# Patient Record
Sex: Female | Born: 1995 | Race: Black or African American | Hispanic: No | Marital: Married | Smoking: Never smoker
Health system: Southern US, Community
[De-identification: ages and names within clinical notes are randomized; demographics above are authoritative.]

## PROBLEM LIST (undated history)

## (undated) ENCOUNTER — Inpatient Hospital Stay (HOSPITAL_COMMUNITY): Payer: Self-pay

## (undated) DIAGNOSIS — Z789 Other specified health status: Secondary | ICD-10-CM

---

## 2015-12-28 NOTE — L&D Delivery Note (Addendum)
20 year old G1 now P1 with spontaneous onset of labor with uncomplicated labor course.   Delivery Note At 9:47 AM a viable female was delivered via Vaginal, Spontaneous Delivery (Presentation vertex OA: ;  ).  APGAR: 9, 9; weight pending  .   Placenta status: delivered with gentle traction.  Cord: 3 vessel  with the following complications:none .  Anesthesia:  Epidural  Episiotomy: None Lacerations:  Periurethral left repaired Suture Repair: 3.0 vicryl Est. Blood Loss (mL):  150 ml  Mom to postpartum.  Baby to Couplet care / Skin to Skin.  Charlesetta GaribaldiKathryn Lorraine Kooistra CNM 09/30/2016, 10:07 AM  OB FELLOW DELIVERY ATTESTATION  I was gloved and present for the delivery in its entirety, and I agree with the above resident's note.    Ernestina PennaNicholas Broghan Pannone, MD 5:41 PM

## 2016-03-11 ENCOUNTER — Other Ambulatory Visit (HOSPITAL_COMMUNITY): Payer: Self-pay | Admitting: Nurse Practitioner

## 2016-03-11 ENCOUNTER — Encounter (HOSPITAL_COMMUNITY): Payer: Self-pay | Admitting: Nurse Practitioner

## 2016-03-11 DIAGNOSIS — Z3A13 13 weeks gestation of pregnancy: Secondary | ICD-10-CM

## 2016-03-11 DIAGNOSIS — Z3682 Encounter for antenatal screening for nuchal translucency: Secondary | ICD-10-CM

## 2016-04-05 ENCOUNTER — Ambulatory Visit (HOSPITAL_COMMUNITY)
Admission: RE | Admit: 2016-04-05 | Discharge: 2016-04-05 | Disposition: A | Discharge status: Home / Self Care | Payer: Medicaid Other | Source: Ambulatory Visit | Attending: Nurse Practitioner | Admitting: Nurse Practitioner

## 2016-04-05 ENCOUNTER — Encounter (HOSPITAL_COMMUNITY): Payer: Self-pay

## 2016-04-05 DIAGNOSIS — Z36 Encounter for antenatal screening of mother: Secondary | ICD-10-CM | POA: Insufficient documentation

## 2016-04-05 DIAGNOSIS — Z3682 Encounter for antenatal screening for nuchal translucency: Secondary | ICD-10-CM

## 2016-04-05 DIAGNOSIS — Z3A13 13 weeks gestation of pregnancy: Secondary | ICD-10-CM | POA: Insufficient documentation

## 2016-04-09 ENCOUNTER — Other Ambulatory Visit (HOSPITAL_COMMUNITY): Payer: Self-pay

## 2016-08-04 IMAGING — US US MFM FETAL NUCHAL TRANSLUCENCY
1 series · 15 of 28 positions shown · non-contrast
Comparison: none

[Series 1: us mfm fetal nuchal translucency · 15 of 53 slices shown]
[im 1/53]
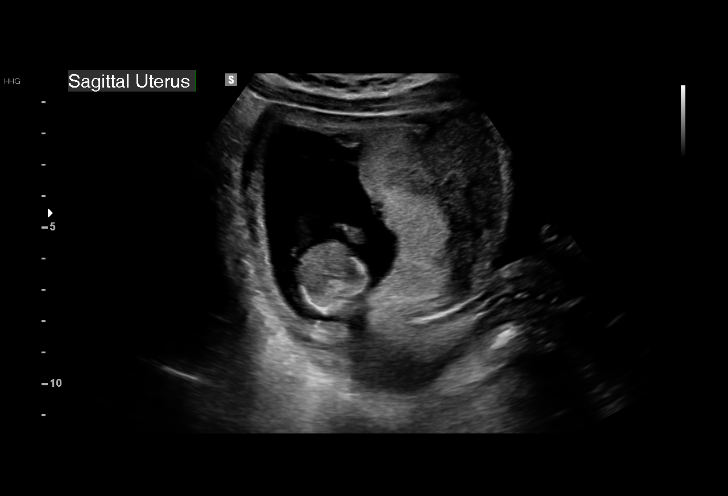
[im 4/53]
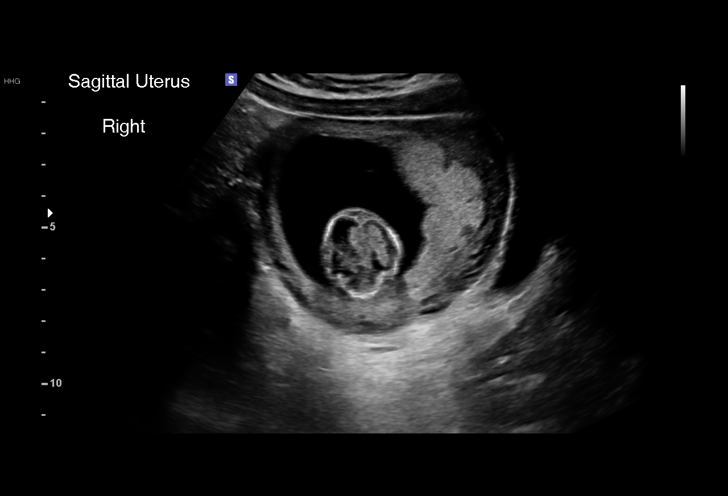
[im 8/53]
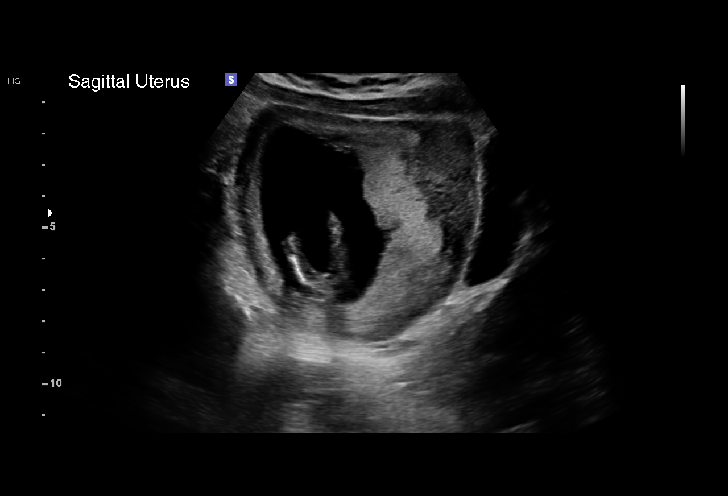
[im 12/53]
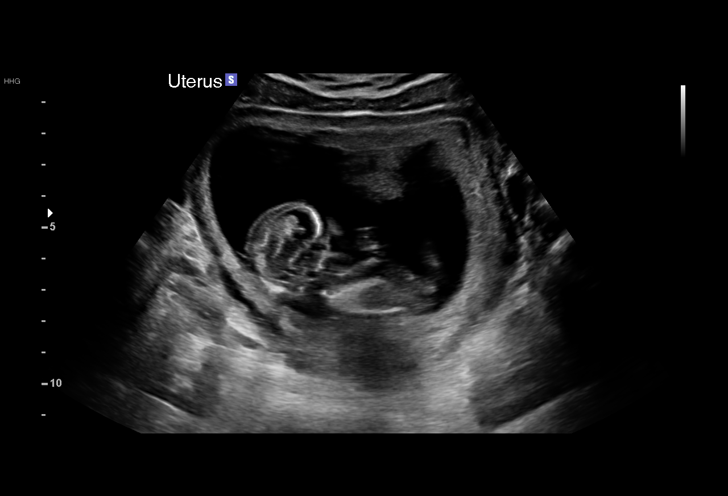
[im 16/53]
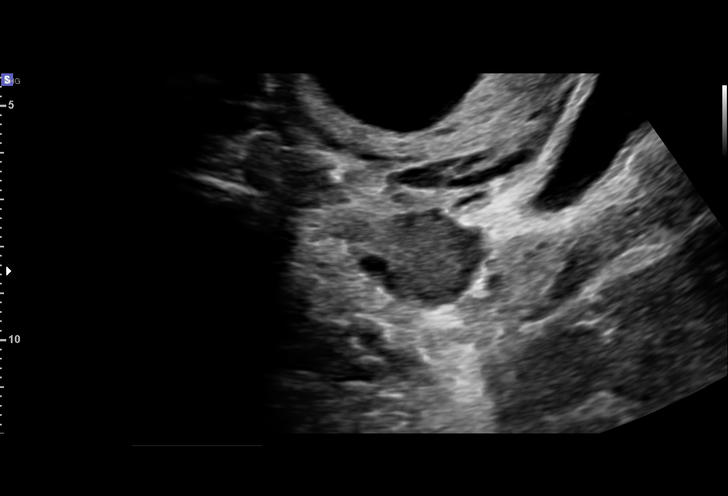
[im 20/53]
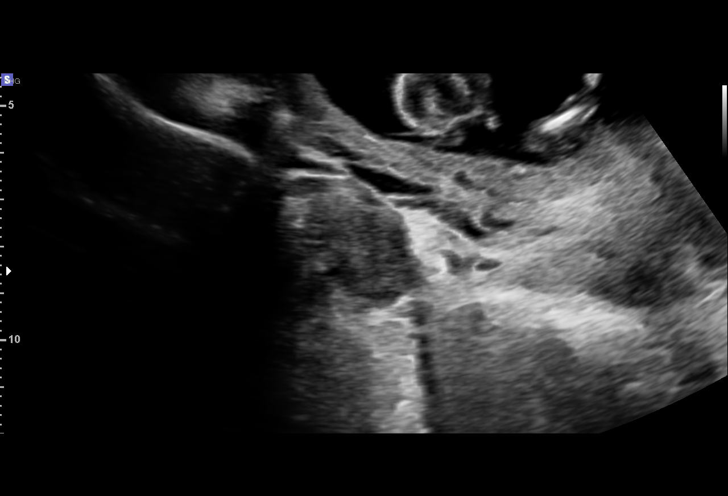
[im 24/53]
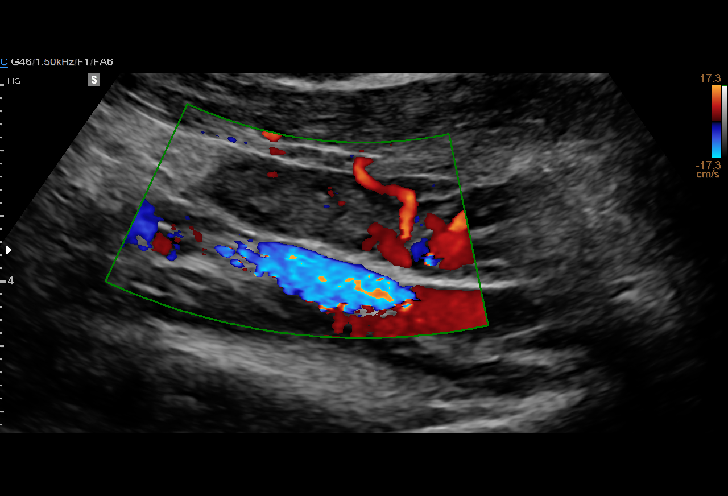
[im 27/53]
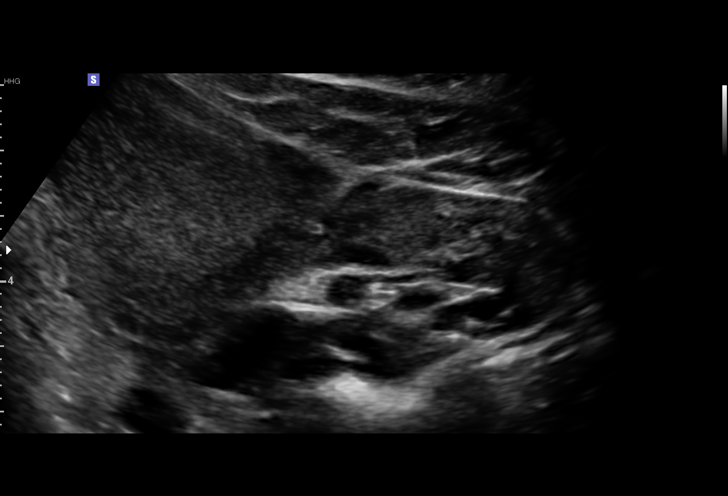
[im 29/53]
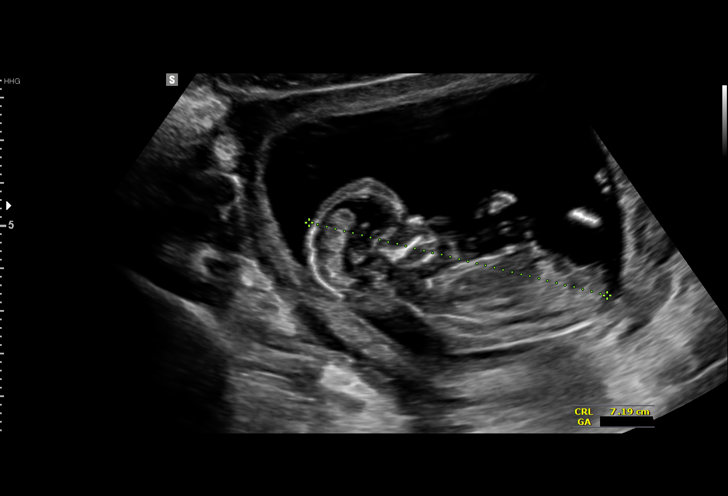
[im 33/53]
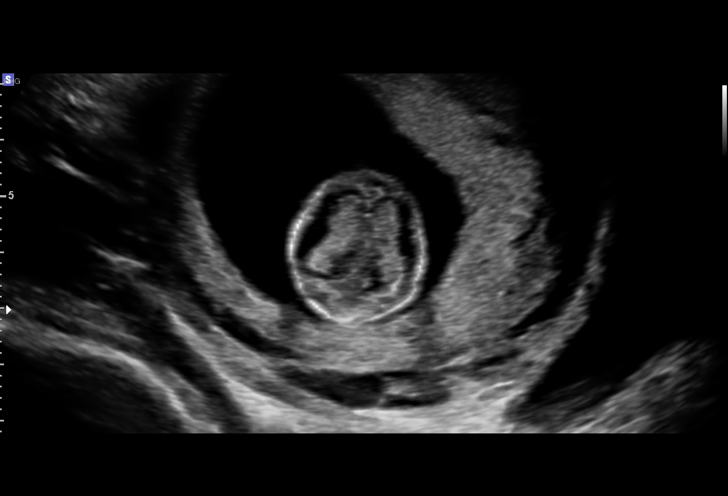
[im 37/53]
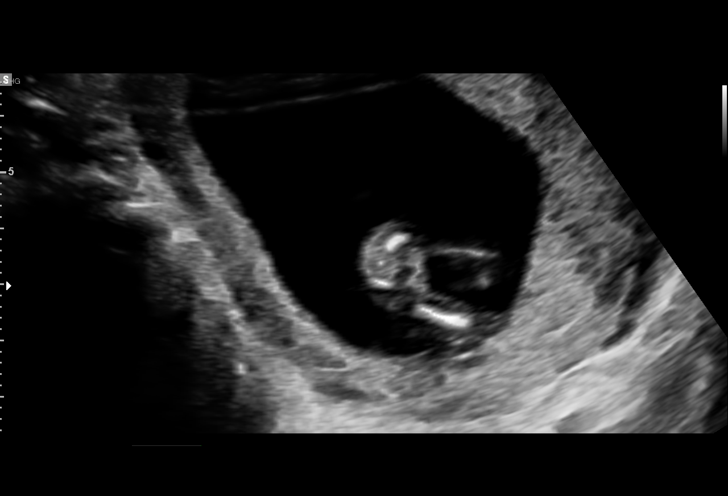
[im 41/53]
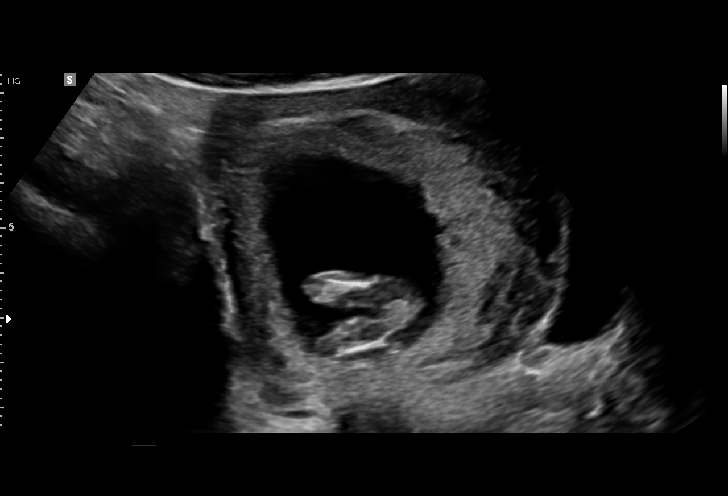
[im 45/53]
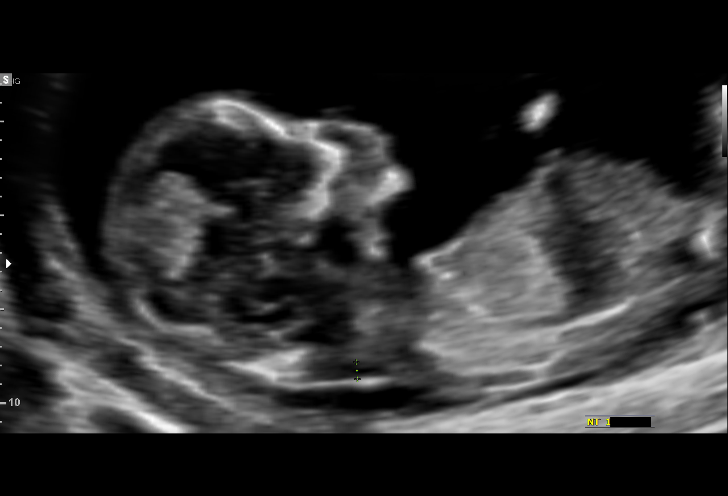
[im 49/53]
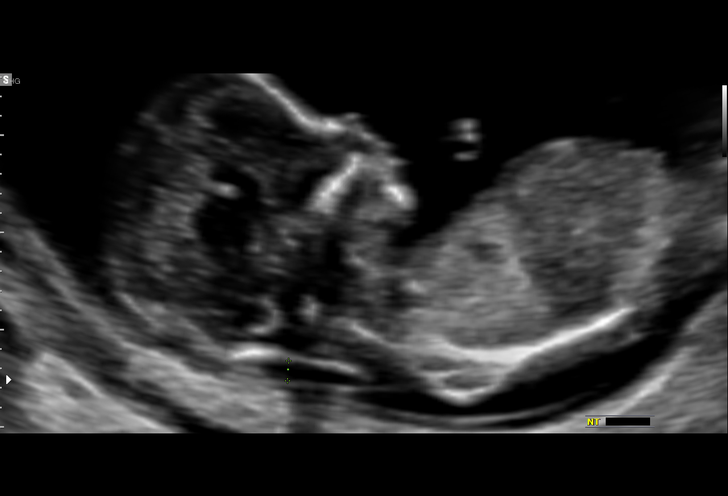
[im 53/53]
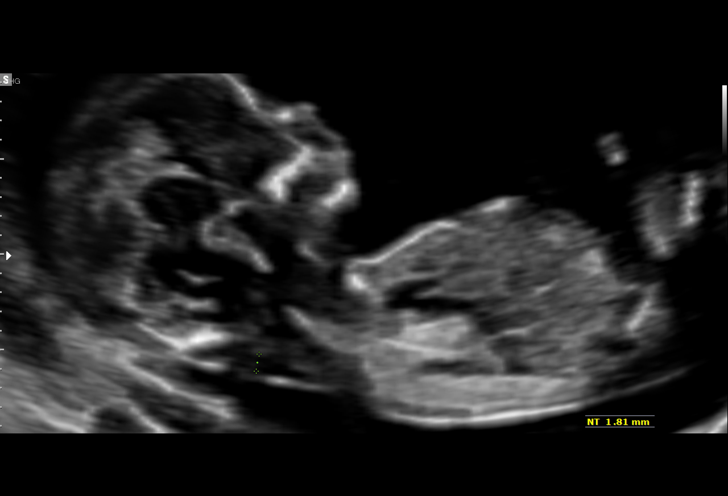

[15 of 28 positions shown; findings below may reference images not displayed]

DASS NP

TRANSLUCENCY

1  Z[facility] MORTENSEN            [id number]      [id number]     [id number]
Indications

First trimester aneuploidy screen (NT)         Z36
13 weeks gestation of pregnancy
Fetal Evaluation

Num Of Fetuses:     1
Fetal Heart         162
Rate(bpm):
Cardiac Activity:   Observed
Placenta:           Anterior, above cervical os

Amniotic Fluid
AFI FV:      Subjectively within normal limits
Gestational Age

LMP:           13w 2d        Date:  01/03/16                 EDD:   10/09/16
Best:          13w 2d     Det. By:  LMP  (01/03/16)          EDD:   10/09/16
1st Trimester Genetic Sonogram Screening

CRL:            72.6  mm    G. Age:   13w 1d                 EDD:   10/10/16
Nuc Trans:       2.1  mm

Nasal Bone:                 Present
Cervix Uterus Adnexa

Cervix
Normal appearance by transabdominal scan.
Uterus
No abnormality visualized.

Left Ovary
Size(cm)       2.7  x   1.9    x  1.1       Vol(ml): 3

Right Ovary
Size(cm)       2.9  x   2.3    x  2.1       Vol(ml):

Adnexa:       No adnexal mass visualized.
Impression

SIUP at 13+2 weeks
No gross abnormalities identified
NT measurement was within normal limits for this GA; NB
present
Normal amniotic fluid volume
Measurements consistent with LMP dating
Recommendations

Offer MSAFP in the second trimester for ONTD screening
Offer anatomy U/S by 18 weeks

## 2016-08-08 ENCOUNTER — Inpatient Hospital Stay (HOSPITAL_COMMUNITY)
Admission: AD | Admit: 2016-08-08 | Discharge: 2016-08-08 | Disposition: A | Discharge status: Home / Self Care | Payer: Medicaid Other | Source: Ambulatory Visit | Attending: Obstetrics & Gynecology | Admitting: Obstetrics & Gynecology

## 2016-08-08 DIAGNOSIS — O26893 Other specified pregnancy related conditions, third trimester: Secondary | ICD-10-CM | POA: Diagnosis present

## 2016-08-08 DIAGNOSIS — Z3A31 31 weeks gestation of pregnancy: Secondary | ICD-10-CM | POA: Insufficient documentation

## 2016-08-08 DIAGNOSIS — N898 Other specified noninflammatory disorders of vagina: Secondary | ICD-10-CM | POA: Insufficient documentation

## 2016-08-08 LAB — URINALYSIS, ROUTINE W REFLEX MICROSCOPIC
Bilirubin Urine: NEGATIVE
Glucose, UA: NEGATIVE mg/dL
Hgb urine dipstick: NEGATIVE
KETONES UR: NEGATIVE mg/dL
LEUKOCYTES UA: NEGATIVE
NITRITE: NEGATIVE
PROTEIN: NEGATIVE mg/dL
Specific Gravity, Urine: 1.01 (ref 1.005–1.030)
pH: 7 (ref 5.0–8.0)

## 2016-08-08 LAB — POCT FERN TEST: POCT FERN TEST: NEGATIVE

## 2016-08-08 NOTE — MAU Provider Note (Signed)
History   [id number]   Chief Complaint  Patient presents with  . Vaginal Discharge    HPI Alexandra Wright is a 20 y.o. female  G1P0000 here with report of watery vaginal discharge that began at approximately 2000.  Leaking of fluid has not continued.  Pt denies contractions and denies vaginal bleeding.  Last intercourse 3 hours prior to the discharge.   +fetal movement.  Denies any problems during this pregnancy.  [facility]    All other systems negative.    Patient's last menstrual period was 01/03/2016 (lmp unknown).  OB History  Gravida Para Term Preterm AB Living  1 0 0 0 0 0  SAB TAB Ectopic Multiple Live Births  0 0 0 0      # Outcome Date GA Lbr Len/2nd Weight Sex Delivery Anes PTL Lv  1 Current               No past medical history on file.  No family history on file.  Social History   Social History  . Marital status: Married    Spouse name: N/A  . Number of children: N/A  . Years of education: N/A   Social History Main Topics  . Smoking status: Not on file  . Smokeless tobacco: Not on file  . Alcohol use Not on file  . Drug use: Unknown  . Sexual activity: Not on file   Other Topics Concern  . Not on file   Social History Narrative  . No narrative on file    No Known Allergies  No current facility-administered medications on file prior to encounter.    Current Outpatient Prescriptions on File Prior to Encounter  Medication Sig Dispense Refill  . Prenatal Vit-Fe Fumarate-FA (PRENATAL MULTIVITAMIN) TABS tablet Take 1 tablet by mouth daily at 12 noon.       Review of Systems  Gastrointestinal: Negative for abdominal pain.  Genitourinary: Positive for vaginal discharge. Negative for pelvic pain and vaginal bleeding.  All other systems reviewed and are negative.    Physical Exam   Vitals:   08/08/16 2250  BP: 102/59  Pulse: 101  Resp: 18  Temp: 98.3 F (36.8 C)  TempSrc: Oral    Physical Exam   Constitutional: She is oriented to person, place, and time. She appears well-developed and well-nourished. No distress.  HENT:  Head: Normocephalic.  Neck: Normal range of motion. Neck supple.  Cardiovascular: Normal rate, regular rhythm and normal heart sounds.   Respiratory: Effort normal and breath sounds normal.  GI: Soft. There is no tenderness.  Genitourinary: No bleeding in the vagina. No vaginal discharge found.  Genitourinary Comments: Negative ferns; negative pooling  Neurological: She is alert and oriented to person, place, and time.  Skin: Skin is warm and dry.   FHR 140's, +accels Toco - none  Dilation: Closed Effacement (%): Thick Exam by:: Margarita Mail, CNM  MAU Course  Procedures  MDM Results for orders placed or performed during the hospital encounter of 08/08/16 (from the past 24 hour(s))  Urinalysis, Routine w reflex microscopic (not at [facility])     Status: None   Collection Time: 08/08/16 10:47 PM  Result Value Ref Range   Color, Urine YELLOW YELLOW   APPearance CLEAR CLEAR   Specific Gravity, Urine 1.010 1.005 - 1.030   pH 7.0 5.0 - 8.0   Glucose, UA NEGATIVE NEGATIVE mg/dL   Hgb urine dipstick NEGATIVE NEGATIVE   Bilirubin Urine NEGATIVE NEGATIVE  Ketones, ur NEGATIVE NEGATIVE mg/dL   Protein, ur NEGATIVE NEGATIVE mg/dL   Nitrite NEGATIVE NEGATIVE   Leukocytes, UA NEGATIVE NEGATIVE  Fern Test     Status: None   Collection Time: 08/08/16 11:07 PM  Result Value Ref Range   POCT Fern Test Negative = intact amniotic membranes      Assessment and Plan  20 y.o. G1P0000 at 156w1d IUP Vaginal Discharge s/p Sexual Intercourse Reactive NST  Plan: Discharge home Explained normalcy of discharge after intercourse Keep scheduled appt   Marlis EdelsonWalidah N Karim, CNM 08/08/2016 11:25 PM

## 2016-08-08 NOTE — MAU Note (Signed)
Pt presents complaining of a gush of fluid at home. States it has stopped but seemed mucousy at home. Denies vaginal bleeding. Reports good fetal movement. States the fluid was clear.

## 2016-09-29 ENCOUNTER — Encounter (HOSPITAL_COMMUNITY): Payer: Self-pay

## 2016-09-29 ENCOUNTER — Inpatient Hospital Stay (HOSPITAL_COMMUNITY): Payer: Medicaid Other

## 2016-09-29 ENCOUNTER — Inpatient Hospital Stay (HOSPITAL_COMMUNITY)
Admission: AD | Admit: 2016-09-29 | Discharge: 2016-09-29 | Disposition: A | Discharge status: Home / Self Care | Payer: Medicaid Other | Source: Ambulatory Visit | Attending: Obstetrics and Gynecology | Admitting: Obstetrics and Gynecology

## 2016-09-29 DIAGNOSIS — Z3A38 38 weeks gestation of pregnancy: Secondary | ICD-10-CM | POA: Insufficient documentation

## 2016-09-29 DIAGNOSIS — O283 Abnormal ultrasonic finding on antenatal screening of mother: Secondary | ICD-10-CM | POA: Diagnosis present

## 2016-09-29 NOTE — MAU Note (Signed)
Patient presents with ctx every 5 min. Patient denies any bleeding or LOF.

## 2016-09-29 NOTE — MAU Provider Note (Signed)
Called by RN regarding fetal heart tracing. Patient came in for a labor evaluation. Patient is NOT found to be in labor. However, FHT is not reactive, but is category I. 145, mod var, no accels, no decels. IVF has been given, PO intake was tried.   3:54 AM - Will perform BPP.   4:21 AM - BPP was 8/8. OK for discharge.  Cleda ClarksElizabeth W. Christmas Faraci, DO  [facility] Fellow [facility]

## 2016-09-29 NOTE — Progress Notes (Signed)
Called to notify of pt arrival in MAU. MD to call back

## 2016-09-29 NOTE — Discharge Instructions (Signed)
Third Trimester of Pregnancy °The third trimester is from week 29 through week 42, months 7 through 9. The third trimester is a time when the fetus is growing rapidly. At the end of the ninth month, the fetus is about 20 inches in length and weighs 6-10 pounds.  °BODY CHANGES °Your body goes through many changes during pregnancy. The changes vary from woman to woman.  °· Your weight will continue to increase. You can expect to gain 25-35 pounds (11-16 kg) by the end of the pregnancy. °· You may begin to get stretch marks on your hips, abdomen, and breasts. °· You may urinate more often because the fetus is moving lower into your pelvis and pressing on your bladder. °· You may develop or continue to have heartburn as a result of your pregnancy. °· You may develop constipation because certain hormones are causing the muscles that push waste through your intestines to slow down. °· You may develop hemorrhoids or swollen, bulging veins (varicose veins). °· You may have pelvic pain because of the weight gain and pregnancy hormones relaxing your joints between the bones in your pelvis. Backaches may result from overexertion of the muscles supporting your posture. °· You may have changes in your hair. These can include thickening of your hair, rapid growth, and changes in texture. Some women also have hair loss during or after pregnancy, or hair that feels dry or thin. Your hair will most likely return to normal after your baby is born. °· Your breasts will continue to grow and be tender. A yellow discharge may leak from your breasts called colostrum. °· Your belly button may stick out. °· You may feel short of breath because of your expanding uterus. °· You may notice the fetus "dropping," or moving lower in your abdomen. °· You may have a bloody mucus discharge. This usually occurs a few days to a week before labor begins. °· Your cervix becomes thin and soft (effaced) near your due date. °WHAT TO EXPECT AT YOUR PRENATAL  EXAMS  °You will have prenatal exams every 2 weeks until week 36. Then, you will have weekly prenatal exams. During a routine prenatal visit: °· You will be weighed to make sure you and the fetus are growing normally. °· Your blood pressure is taken. °· Your abdomen will be measured to track your baby's growth. °· The fetal heartbeat will be listened to. °· Any test results from the previous visit will be discussed. °· You may have a cervical check near your due date to see if you have effaced. °At around 36 weeks, your caregiver will check your cervix. At the same time, your caregiver will also perform a test on the secretions of the vaginal tissue. This test is to determine if a type of bacteria, Group B streptococcus, is present. Your caregiver will explain this further. °Your caregiver may ask you: °· What your birth plan is. °· How you are feeling. °· If you are feeling the baby move. °· If you have had any abnormal symptoms, such as leaking fluid, bleeding, severe headaches, or abdominal cramping. °· If you are using any tobacco products, including cigarettes, chewing tobacco, and electronic cigarettes. °· If you have any questions. °Other tests or screenings that may be performed during your third trimester include: °· Blood tests that check for low iron levels (anemia). °· Fetal testing to check the health, activity level, and growth of the fetus. Testing is done if you have certain medical conditions or if   there are problems during the pregnancy. °· HIV (human immunodeficiency virus) testing. If you are at high risk, you may be screened for HIV during your third trimester of pregnancy. °FALSE LABOR °You may feel small, irregular contractions that eventually go away. These are called Braxton Hicks contractions, or false labor. Contractions may last for hours, days, or even weeks before true labor sets in. If contractions come at regular intervals, intensify, or become painful, it is best to be seen by your  caregiver.  °SIGNS OF LABOR  °· Menstrual-like cramps. °· Contractions that are 5 minutes apart or less. °· Contractions that start on the top of the uterus and spread down to the lower abdomen and back. °· A sense of increased pelvic pressure or back pain. °· A watery or bloody mucus discharge that comes from the vagina. °If you have any of these signs before the 37th week of pregnancy, call your caregiver right away. You need to go to the hospital to get checked immediately. °HOME CARE INSTRUCTIONS  °· Avoid all smoking, herbs, alcohol, and unprescribed drugs. These chemicals affect the formation and growth of the baby. °· Do not use any tobacco products, including cigarettes, chewing tobacco, and electronic cigarettes. If you need help quitting, ask your health care provider. You may receive counseling support and other resources to help you quit. °· Follow your caregiver's instructions regarding medicine use. There are medicines that are either safe or unsafe to take during pregnancy. °· Exercise only as directed by your caregiver. Experiencing uterine cramps is a good sign to stop exercising. °· Continue to eat regular, healthy meals. °· Wear a good support bra for breast tenderness. °· Do not use hot tubs, steam rooms, or saunas. °· Wear your seat belt at all times when driving. °· Avoid raw meat, uncooked cheese, cat litter boxes, and soil used by cats. These carry germs that can cause birth defects in the baby. °· Take your prenatal vitamins. °· Take 1500-2000 mg of calcium daily starting at the 20th week of pregnancy until you deliver your baby. °· Try taking a stool softener (if your caregiver approves) if you develop constipation. Eat more high-fiber foods, such as fresh vegetables or fruit and whole grains. Drink plenty of fluids to keep your urine clear or pale yellow. °· Take warm sitz baths to soothe any pain or discomfort caused by hemorrhoids. Use hemorrhoid cream if your caregiver approves. °· If  you develop varicose veins, wear support hose. Elevate your feet for 15 minutes, 3-4 times a day. Limit salt in your diet. °· Avoid heavy lifting, wear low heal shoes, and practice good posture. °· Rest a lot with your legs elevated if you have leg cramps or low back pain. °· Visit your dentist if you have not gone during your pregnancy. Use a soft toothbrush to brush your teeth and be gentle when you floss. °· A sexual relationship may be continued unless your caregiver directs you otherwise. °· Do not travel far distances unless it is absolutely necessary and only with the approval of your caregiver. °· Take prenatal classes to understand, practice, and ask questions about the labor and delivery. °· Make a trial run to the hospital. °· Pack your hospital bag. °· Prepare the baby's nursery. °· Continue to go to all your prenatal visits as directed by your caregiver. °SEEK MEDICAL CARE IF: °· You are unsure if you are in labor or if your water has broken. °· You have dizziness. °· You have   mild pelvic cramps, pelvic pressure, or nagging pain in your abdominal area. °· You have persistent nausea, vomiting, or diarrhea. °· You have a bad smelling vaginal discharge. °· You have pain with urination. °SEEK IMMEDIATE MEDICAL CARE IF:  °· You have a fever. °· You are leaking fluid from your vagina. °· You have spotting or bleeding from your vagina. °· You have severe abdominal cramping or pain. °· You have rapid weight loss or gain. °· You have shortness of breath with chest pain. °· You notice sudden or extreme swelling of your face, hands, ankles, feet, or legs. °· You have not felt your baby move in over an hour. °· You have severe headaches that do not go away with medicine. °· You have vision changes. °  °This information is not intended to replace advice given to you by your health care provider. Make sure you discuss any questions you have with your health care provider. °  °Document Released: 12/07/2001 Document  Revised: 01/03/2015 Document Reviewed: 02/13/2013 °[employer]vier Interactive Patient Education ©2016 [employer] °Fetal Movement Counts °Patient Name: __________________________________________________ Patient Due Date: ____________________ °Performing a fetal movement count is highly recommended in high-risk pregnancies, but it is good for every pregnant woman to do. Your health care provider may ask you to start counting fetal movements at 28 weeks of the pregnancy. Fetal movements often increase: °· After eating a full meal. °· After physical activity. °· After eating or drinking something sweet or cold. °· At rest. °Pay attention to when you feel the baby is most active. This will help you notice a pattern of your baby's sleep and wake cycles and what factors contribute to an increase in fetal movement. It is important to perform a fetal movement count at the same time each day when your baby is normally most active.  °HOW TO COUNT FETAL MOVEMENTS °1. Find a quiet and comfortable area to sit or lie down on your left side. Lying on your left side provides the best blood and oxygen circulation to your baby. °2. Write down the day and time on a sheet of paper or in a journal. °3. Start counting kicks, flutters, swishes, rolls, or jabs in a 2-hour period. You should feel at least 10 movements within 2 hours. °4. If you do not feel 10 movements in 2 hours, wait 2-3 hours and count again. Look for a change in the pattern or not enough counts in 2 hours. °SEEK MEDICAL CARE IF: °· You feel less than 10 counts in 2 hours, tried twice. °· There is no movement in over an hour. °· The pattern is changing or taking longer each day to reach 10 counts in 2 hours. °· You feel the baby is not moving as he or she usually does. °Date: ____________ Movements: ____________ Start time: ____________ Finish time: ____________  °Date: ____________ Movements: ____________ Start time: ____________ Finish time: ____________ °Date:  ____________ Movements: ____________ Start time: ____________ Finish time: ____________ °Date: ____________ Movements: ____________ Start time: ____________ Finish time: ____________ °Date: ____________ Movements: ____________ Start time: ____________ Finish time: ____________ °Date: ____________ Movements: ____________ Start time: ____________ Finish time: ____________ °Date: ____________ Movements: ____________ Start time: ____________ Finish time: ____________ °Date: ____________ Movements: ____________ Start time: ____________ Finish time: ____________  °Date: ____________ Movements: ____________ Start time: ____________ Finish time: ____________ °Date: ____________ Movements: ____________ Start time: ____________ Finish time: ____________ °Date: ____________ Movements: ____________ Start time: ____________ Finish time: ____________ °Date: ____________ Movements: ____________ Start time: ____________ Finish time: ____________ °Date:   ____________ Movements: ____________ Start time: ____________ Finish time: ____________ °Date: ____________ Movements: ____________ Start time: ____________ Finish time: ____________ °Date: ____________ Movements: ____________ Start time: ____________ Finish time: ____________  °Date: ____________ Movements: ____________ Start time: ____________ Finish time: ____________ °Date: ____________ Movements: ____________ Start time: ____________ Finish time: ____________ °Date: ____________ Movements: ____________ Start time: ____________ Finish time: ____________ °Date: ____________ Movements: ____________ Start time: ____________ Finish time: ____________ °Date: ____________ Movements: ____________ Start time: ____________ Finish time: ____________ °Date: ____________ Movements: ____________ Start time: ____________ Finish time: ____________ °Date: ____________ Movements: ____________ Start time: ____________ Finish time: ____________  °Date: ____________ Movements: ____________ Start  time: ____________ Finish time: ____________ °Date: ____________ Movements: ____________ Start time: ____________ Finish time: ____________ °Date: ____________ Movements: ____________ Start time: ____________ Finish time: ____________ °Date: ____________ Movements: ____________ Start time: ____________ Finish time: ____________ °Date: ____________ Movements: ____________ Start time: ____________ Finish time: ____________ °Date: ____________ Movements: ____________ Start time: ____________ Finish time: ____________ °Date: ____________ Movements: ____________ Start time: ____________ Finish time: ____________  °Date: ____________ Movements: ____________ Start time: ____________ Finish time: ____________ °Date: ____________ Movements: ____________ Start time: ____________ Finish time: ____________ °Date: ____________ Movements: ____________ Start time: ____________ Finish time: ____________ °Date: ____________ Movements: ____________ Start time: ____________ Finish time: ____________ °Date: ____________ Movements: ____________ Start time: ____________ Finish time: ____________ °Date: ____________ Movements: ____________ Start time: ____________ Finish time: ____________ °Date: ____________ Movements: ____________ Start time: ____________ Finish time: ____________  °Date: ____________ Movements: ____________ Start time: ____________ Finish time: ____________ °Date: ____________ Movements: ____________ Start time: ____________ Finish time: ____________ °Date: ____________ Movements: ____________ Start time: ____________ Finish time: ____________ °Date: ____________ Movements: ____________ Start time: ____________ Finish time: ____________ °Date: ____________ Movements: ____________ Start time: ____________ Finish time: ____________ °Date: ____________ Movements: ____________ Start time: ____________ Finish time: ____________ °Date: ____________ Movements: ____________ Start time: ____________ Finish time: ____________    °Date: ____________ Movements: ____________ Start time: ____________ Finish time: ____________ °Date: ____________ Movements: ____________ Start time: ____________ Finish time: ____________ °Date: ____________ Movements: ____________ Start time: ____________ Finish time: ____________ °Date: ____________ Movements: ____________ Start time: ____________ Finish time: ____________ °Date: ____________ Movements: ____________ Start time: ____________ Finish time: ____________ °Date: ____________ Movements: ____________ Start time: ____________ Finish time: ____________ °Date: ____________ Movements: ____________ Start time: ____________ Finish time: ____________  °Date: ____________ Movements: ____________ Start time: ____________ Finish time: ____________ °Date: ____________ Movements: ____________ Start time: ____________ Finish time: ____________ °Date: ____________ Movements: ____________ Start time: ____________ Finish time: ____________ °Date: ____________ Movements: ____________ Start time: ____________ Finish time: ____________ °Date: ____________ Movements: ____________ Start time: ____________ Finish time: ____________ °Date: ____________ Movements: ____________ Start time: ____________ Finish time: ____________ °  °This information is not intended to replace advice given to you by your health care provider. Make sure you discuss any questions you have with your health care provider. °  °Document Released: 01/12/2007 Document Revised: 01/03/2015 Document Reviewed: 10/09/2012 °[employer]vier Interactive Patient Education ©2016 [employer] °Braxton Hicks Contractions °Contractions of the uterus can occur throughout pregnancy. Contractions are not always a sign that you are in labor.  °WHAT ARE BRAXTON HICKS CONTRACTIONS?  °Contractions that occur before labor are called Braxton Hicks contractions, or false labor. Toward the end of pregnancy (32-34 weeks), these contractions can develop more often and may become more  forceful. This is not true labor because these contractions do not result in opening (dilatation) and thinning of the cervix. They are sometimes difficult to tell apart from true labor because these contractions can be forceful and people have different pain tolerances. You   should not feel embarrassed if you go to the hospital with false labor. Sometimes, the only way to tell if you are in true labor is for your health care provider to look for changes in the cervix. °If there are no prenatal problems or other health problems associated with the pregnancy, it is completely safe to be sent home with false labor and await the onset of true labor. °HOW CAN YOU TELL THE DIFFERENCE BETWEEN TRUE AND FALSE LABOR? °False Labor °· The contractions of false labor are usually shorter and not as hard as those of true labor.   °· The contractions are usually irregular.   °· The contractions are often felt in the front of the lower abdomen and in the groin.   °· The contractions may go away when you walk around or change positions while lying down.   °· The contractions get weaker and are shorter lasting as time goes on.   °· The contractions do not usually become progressively stronger, regular, and closer together as with true labor.   °True Labor °· Contractions in true labor last 30-70 seconds, become very regular, usually become more intense, and increase in frequency.   °· The contractions do not go away with walking.   °· The discomfort is usually felt in the top of the uterus and spreads to the lower abdomen and low back.   °· True labor can be determined by your health care provider with an exam. This will show that the cervix is dilating and getting thinner.   °WHAT TO REMEMBER °· Keep up with your usual exercises and follow other instructions given by your health care provider.   °· Take medicines as directed by your health care provider.   °· Keep your regular prenatal appointments.   °· Eat and drink lightly if you  think you are going into labor.   °· If Braxton Hicks contractions are making you uncomfortable:   °¨ Change your position from lying down or resting to walking, or from walking to resting.   °¨ Sit and rest in a tub of warm water.   °¨ Drink 2-3 glasses of water. Dehydration may cause these contractions.   °¨ Do slow and deep breathing several times an hour.   °WHEN SHOULD I SEEK IMMEDIATE MEDICAL CARE? °Seek immediate medical care if: °· Your contractions become stronger, more regular, and closer together.   °· You have fluid leaking or gushing from your vagina.   °· You have a fever.   °· You pass blood-tinged mucus.   °· You have vaginal bleeding.   °· You have continuous abdominal pain.   °· You have low back pain that you never had before.   °· You feel your baby's head pushing down and causing pelvic pressure.   °· Your baby is not moving as much as it used to.   °  °This information is not intended to replace advice given to you by your health care provider. Make sure you discuss any questions you have with your health care provider. °  °Document Released: 12/13/2005 Document Revised: 12/18/2013 Document Reviewed: 09/24/2013 °Elsevier Interactive Patient Education ©2016 [employer] ° °

## 2016-09-29 NOTE — Progress Notes (Signed)
Called back for report on patient. Report given regarding cervical exam and non reactive NST. Will talk to Dr. Omer JackMumaw and call back

## 2016-09-29 NOTE — Progress Notes (Signed)
Called to notify of pt BPP results and asked if patient needs to be back on the monitor. Will talk to Dr. Omer JackMumaw and call back

## 2016-09-30 ENCOUNTER — Inpatient Hospital Stay (HOSPITAL_COMMUNITY)
Admission: AD | Admit: 2016-09-30 | Discharge: 2016-10-02 | DRG: 775 | Disposition: A | Discharge status: Home / Self Care | Payer: Medicaid Other | Source: Ambulatory Visit | Attending: Obstetrics and Gynecology | Admitting: Obstetrics and Gynecology

## 2016-09-30 ENCOUNTER — Inpatient Hospital Stay (HOSPITAL_COMMUNITY): Payer: Medicaid Other | Admitting: Anesthesiology

## 2016-09-30 ENCOUNTER — Encounter (HOSPITAL_COMMUNITY): Payer: Self-pay

## 2016-09-30 DIAGNOSIS — Z3403 Encounter for supervision of normal first pregnancy, third trimester: Secondary | ICD-10-CM | POA: Diagnosis present

## 2016-09-30 DIAGNOSIS — Z3A38 38 weeks gestation of pregnancy: Secondary | ICD-10-CM

## 2016-09-30 DIAGNOSIS — O99824 Streptococcus B carrier state complicating childbirth: Secondary | ICD-10-CM | POA: Diagnosis present

## 2016-09-30 HISTORY — DX: Other specified health status: Z78.9

## 2016-09-30 LAB — CBC
HCT: 33.2 % — ABNORMAL LOW (ref 36.0–46.0)
Hemoglobin: 11.5 g/dL — ABNORMAL LOW (ref 12.0–15.0)
MCH: 28.6 pg (ref 26.0–34.0)
MCHC: 34.6 g/dL (ref 30.0–36.0)
MCV: 82.6 fL (ref 78.0–100.0)
PLATELETS: 277 10*3/uL (ref 150–400)
RBC: 4.02 MIL/uL (ref 3.87–5.11)
RDW: 13.3 % (ref 11.5–15.5)
WBC: 7.5 10*3/uL (ref 4.0–10.5)

## 2016-09-30 LAB — OB RESULTS CONSOLE ABO/RH: RH TYPE: POSITIVE

## 2016-09-30 LAB — TYPE AND SCREEN
ABO/RH(D): O POS
ANTIBODY SCREEN: NEGATIVE

## 2016-09-30 LAB — OB RESULTS CONSOLE HEPATITIS B SURFACE ANTIGEN: HEP B S AG: NEGATIVE

## 2016-09-30 LAB — OB RESULTS CONSOLE HIV ANTIBODY (ROUTINE TESTING): HIV: NONREACTIVE

## 2016-09-30 LAB — OB RESULTS CONSOLE GBS: GBS: POSITIVE

## 2016-09-30 LAB — OB RESULTS CONSOLE RUBELLA ANTIBODY, IGM: Rubella: IMMUNE

## 2016-09-30 LAB — OB RESULTS CONSOLE GC/CHLAMYDIA
Chlamydia: NEGATIVE
Gonorrhea: NEGATIVE

## 2016-09-30 LAB — ABO/RH: ABO/RH(D): O POS

## 2016-09-30 LAB — OB RESULTS CONSOLE RPR: RPR: NONREACTIVE

## 2016-09-30 MED ORDER — COCONUT OIL OIL
1.0000 "application " | TOPICAL_OIL | Status: DC | PRN
  Filled 2016-09-30: qty 120

## 2016-09-30 MED ORDER — DIPHENHYDRAMINE HCL 50 MG/ML IJ SOLN: 12.5000 mg | INTRAMUSCULAR | Status: DC | PRN

## 2016-09-30 MED ORDER — FENTANYL CITRATE (PF) 100 MCG/2ML IJ SOLN
50.0000 ug | INTRAMUSCULAR | Status: DC | PRN
  Administered 2016-09-30: 100 ug via INTRAVENOUS
  Filled 2016-09-30: qty 2

## 2016-09-30 MED ORDER — DIPHENHYDRAMINE HCL 25 MG PO CAPS: 25.0000 mg | ORAL_CAPSULE | Freq: Four times a day (QID) | ORAL | Status: DC | PRN

## 2016-09-30 MED ORDER — FLEET ENEMA 7-19 GM/118ML RE ENEM: 1.0000 | ENEMA | RECTAL | Status: DC | PRN

## 2016-09-30 MED ORDER — BENZOCAINE-MENTHOL 20-0.5 % EX AERO
1.0000 "application " | INHALATION_SPRAY | CUTANEOUS | Status: DC | PRN
  Filled 2016-09-30: qty 56

## 2016-09-30 MED ORDER — WITCH HAZEL-GLYCERIN EX PADS: 1.0000 "application " | MEDICATED_PAD | CUTANEOUS | Status: DC | PRN

## 2016-09-30 MED ORDER — PHENYLEPHRINE 40 MCG/ML (10ML) SYRINGE FOR IV PUSH (FOR BLOOD PRESSURE SUPPORT)
80.0000 ug | PREFILLED_SYRINGE | INTRAVENOUS | Status: DC | PRN
  Filled 2016-09-30: qty 5

## 2016-09-30 MED ORDER — EPHEDRINE 5 MG/ML INJ
10.0000 mg | INTRAVENOUS | Status: DC | PRN
  Filled 2016-09-30: qty 4

## 2016-09-30 MED ORDER — PENICILLIN G POTASSIUM 5000000 UNITS IJ SOLR
5.0000 10*6.[IU] | Freq: Once | INTRAVENOUS | Status: AC
  Administered 2016-09-30: 5 10*6.[IU] via INTRAVENOUS
  Filled 2016-09-30: qty 5

## 2016-09-30 MED ORDER — SIMETHICONE 80 MG PO CHEW: 80.0000 mg | CHEWABLE_TABLET | ORAL | Status: DC | PRN

## 2016-09-30 MED ORDER — LACTATED RINGERS IV SOLN: 500.0000 mL | INTRAVENOUS | Status: DC | PRN

## 2016-09-30 MED ORDER — FENTANYL 2.5 MCG/ML BUPIVACAINE 1/10 % EPIDURAL INFUSION (WH - ANES): 14.0000 mL/h | INTRAMUSCULAR | Status: DC | PRN

## 2016-09-30 MED ORDER — LACTATED RINGERS IV SOLN: 500.0000 mL | Freq: Once | INTRAVENOUS | Status: DC

## 2016-09-30 MED ORDER — PRENATAL MULTIVITAMIN CH
1.0000 | ORAL_TABLET | Freq: Every day | ORAL | Status: DC
  Administered 2016-10-01 – 2016-10-02 (×2): 1 via ORAL
  Filled 2016-09-30 (×2): qty 1

## 2016-09-30 MED ORDER — ONDANSETRON HCL 4 MG PO TABS: 4.0000 mg | ORAL_TABLET | ORAL | Status: DC | PRN

## 2016-09-30 MED ORDER — TETANUS-DIPHTH-ACELL PERTUSSIS 5-2.5-18.5 LF-MCG/0.5 IM SUSP: 0.5000 mL | Freq: Once | INTRAMUSCULAR | Status: DC

## 2016-09-30 MED ORDER — OXYTOCIN BOLUS FROM INFUSION
500.0000 mL | Freq: Once | INTRAVENOUS | Status: AC
  Administered 2016-09-30: 500 mL via INTRAVENOUS

## 2016-09-30 MED ORDER — PHENYLEPHRINE 40 MCG/ML (10ML) SYRINGE FOR IV PUSH (FOR BLOOD PRESSURE SUPPORT)
PREFILLED_SYRINGE | INTRAVENOUS | Status: AC
  Filled 2016-09-30: qty 20

## 2016-09-30 MED ORDER — LIDOCAINE HCL (PF) 1 % IJ SOLN
30.0000 mL | INTRAMUSCULAR | Status: DC | PRN
  Filled 2016-09-30: qty 30

## 2016-09-30 MED ORDER — IBUPROFEN 600 MG PO TABS
600.0000 mg | ORAL_TABLET | Freq: Four times a day (QID) | ORAL | Status: DC
  Administered 2016-09-30 – 2016-10-02 (×9): 600 mg via ORAL
  Filled 2016-09-30 (×9): qty 1

## 2016-09-30 MED ORDER — LIDOCAINE HCL (PF) 1 % IJ SOLN
INTRAMUSCULAR | Status: DC | PRN
  Administered 2016-09-30 (×2): 5 mL

## 2016-09-30 MED ORDER — DIBUCAINE 1 % RE OINT: 1.0000 "application " | TOPICAL_OINTMENT | RECTAL | Status: DC | PRN

## 2016-09-30 MED ORDER — OXYTOCIN 40 UNITS IN LACTATED RINGERS INFUSION - SIMPLE MED
2.5000 [IU]/h | INTRAVENOUS | Status: DC
  Filled 2016-09-30: qty 1000

## 2016-09-30 MED ORDER — ONDANSETRON HCL 4 MG/2ML IJ SOLN: 4.0000 mg | INTRAMUSCULAR | Status: DC | PRN

## 2016-09-30 MED ORDER — SENNOSIDES-DOCUSATE SODIUM 8.6-50 MG PO TABS
2.0000 | ORAL_TABLET | ORAL | Status: DC
  Administered 2016-10-01 – 2016-10-02 (×2): 2 via ORAL
  Filled 2016-09-30 (×2): qty 2

## 2016-09-30 MED ORDER — LACTATED RINGERS IV SOLN
INTRAVENOUS | Status: DC
  Administered 2016-09-30 (×2): via INTRAVENOUS

## 2016-09-30 MED ORDER — PENICILLIN G POTASSIUM 5000000 UNITS IJ SOLR
2.5000 10*6.[IU] | INTRAVENOUS | Status: DC
  Administered 2016-09-30: 2.5 10*6.[IU] via INTRAVENOUS
  Filled 2016-09-30 (×5): qty 2.5

## 2016-09-30 MED ORDER — FENTANYL 2.5 MCG/ML BUPIVACAINE 1/10 % EPIDURAL INFUSION (WH - ANES)
INTRAMUSCULAR | Status: AC
  Filled 2016-09-30: qty 125

## 2016-09-30 MED ORDER — ZOLPIDEM TARTRATE 5 MG PO TABS: 5.0000 mg | ORAL_TABLET | Freq: Every evening | ORAL | Status: DC | PRN

## 2016-09-30 MED ORDER — FENTANYL 2.5 MCG/ML BUPIVACAINE 1/10 % EPIDURAL INFUSION (WH - ANES)
14.0000 mL/h | INTRAMUSCULAR | Status: DC | PRN
  Administered 2016-09-30: 14 mL/h via EPIDURAL

## 2016-09-30 MED ORDER — ACETAMINOPHEN 325 MG PO TABS: 650.0000 mg | ORAL_TABLET | ORAL | Status: DC | PRN

## 2016-09-30 MED ORDER — OXYCODONE-ACETAMINOPHEN 5-325 MG PO TABS: 2.0000 | ORAL_TABLET | ORAL | Status: DC | PRN

## 2016-09-30 MED ORDER — SOD CITRATE-CITRIC ACID 500-334 MG/5ML PO SOLN: 30.0000 mL | ORAL | Status: DC | PRN

## 2016-09-30 MED ORDER — OXYCODONE-ACETAMINOPHEN 5-325 MG PO TABS: 1.0000 | ORAL_TABLET | ORAL | Status: DC | PRN

## 2016-09-30 MED ORDER — ONDANSETRON HCL 4 MG/2ML IJ SOLN: 4.0000 mg | Freq: Four times a day (QID) | INTRAMUSCULAR | Status: DC | PRN

## 2016-09-30 NOTE — MAU Note (Signed)
Patient presents with ctx every 2-3 mins. Patient denies any bleeding or LOF. Fetus active. GBS+

## 2016-09-30 NOTE — H&P (Signed)
LABOR AND DELIVERY ADMISSION HISTORY AND PHYSICAL NOTE  Alexandra Wright is a 20 y.o. female G1P0000 with IUP at [redacted]w[redacted]d by LMP presenting for SOL. Reports contractions coming every few minutes and are painful. They increased in pain and frequency a few hours ago.   She reports positive fetal movement. She denies leakage of fluid or vaginal bleeding.  Prenatal History/Complications: Maternal anemia with Hgb 10.3  Past Medical History: History reviewed. No pertinent past medical history.  Past Surgical History: No past surgical history on file.  Obstetrical History: OB History    Gravida Para Term Preterm AB Living   1 0 0 0 0 0   SAB TAB Ectopic Multiple Live Births   0 0 0 0        Social History: Social History   Social History  . Marital status: Married    Spouse name: N/A  . Number of children: N/A  . Years of education: N/A   Social History Main Topics  . Smoking status: None  . Smokeless tobacco: None  . Alcohol use None  . Drug use: Unknown  . Sexual activity: Not Asked   Other Topics Concern  . None   Social History Narrative  . None    Family History: History reviewed. No pertinent family history.  Allergies: No Known Allergies  Prescriptions Prior to Admission  Medication Sig Dispense Refill Last Dose  . Prenatal Vit-Fe Fumarate-FA (PRENATAL MULTIVITAMIN) TABS tablet Take 1 tablet by mouth daily at 12 noon.   Taking     Review of Systems   All systems reviewed and negative except as stated in HPI  Blood pressure 124/71, pulse 71, temperature 98 F (36.7 C), temperature source Oral, resp. rate 18, last menstrual period 01/03/2016. General appearance: alert and cooperative Abdomen: soft, non-tender Extremities: No calf swelling or tenderness Presentation: cephalic by exam Fetal monitoring: Category 1 Uterine activity: every 3-4 minutes on monitor Dilation: 5 Effacement (%): 100 Exam by:: Scientist, product/process development RN    Prenatal labs: ABO, Rh:  O  pos Antibody:  negative Rubella: Immune RPR:   negative HBsAg:   negative HIV:   nonreactive GBS:   pos Genetic screening:  normal Anatomy US: normal  Prenatal Transfer Tool  Maternal Diabetes: No Genetic Screening: Normal Maternal Ultrasounds/Referrals: Normal Fetal Ultrasounds or other Referrals:  None Maternal Substance Abuse:  No Significant Maternal Medications:  None Significant Maternal Lab Results: Lab values include: Group B Strep positive  Results for orders placed or performed during the hospital encounter of 09/30/16 (from the past 24 hour(s))  CBC   Collection Time: 09/30/16  3:25 AM  Result Value Ref Range   WBC 7.5 4.0 - 10.5 K/uL   RBC 4.02 3.87 - 5.11 MIL/uL   Hemoglobin 11.5 (L) 12.0 - 15.0 g/dL   HCT 96.0 (L) 45.4 - 09.8 %   MCV 82.6 78.0 - 100.0 fL   MCH 28.6 26.0 - 34.0 pg   MCHC 34.6 30.0 - 36.0 g/dL   RDW 11.9 14.7 - 82.9 %   Platelets 277 150 - 400 K/uL    There are no active problems to display for this patient.   Assessment: Alexandra Wright is a 20 y.o. G1P0000 at [redacted]w[redacted]d here for SOL  #labor: proceed normally, pitt if contractions are not adequate #Pain: Plan for epidural #FWB: Category 1  #ID: GBS positive, start penicillin  #MOF: breast #MOC: pills #Circ:  N/A  Harvin Hazel, Medical Student  10/5/20173:41 AM   I saw and examined  the patient with Harvin Hazelhristina Rizk, MS3 and I agree with her assessment and plan.  I have seen and examined this patient and I agree with the above. See my H&P. Cam HaiSHAW, KIMBERLY CNM 7:44 AM 09/30/2016

## 2016-09-30 NOTE — Anesthesia Preprocedure Evaluation (Signed)
Anesthesia Evaluation  Patient identified by MRN, date of birth, ID band Patient awake    Reviewed: Allergy & Precautions, NPO status , Patient's Chart, lab work & pertinent test results  Airway Mallampati: II       Dental no notable dental hx.    Pulmonary neg pulmonary ROS,    Pulmonary exam normal        Cardiovascular negative cardio ROS Normal cardiovascular exam Rate:Normal     Neuro/Psych negative neurological ROS  negative psych ROS   GI/Hepatic negative GI ROS, Neg liver ROS,   Endo/Other  negative endocrine ROS  Renal/GU negative Renal ROS  negative genitourinary   Musculoskeletal negative musculoskeletal ROS (+)   Abdominal   Peds negative pediatric ROS (+)  Hematology negative hematology ROS (+)   Anesthesia Other Findings   Reproductive/Obstetrics (+) Pregnancy                             Anesthesia Physical Anesthesia Plan  ASA: II  Anesthesia Plan: Epidural   Post-op Pain Management:    Induction:   Airway Management Planned:   Additional Equipment:   Intra-op Plan:   Post-operative Plan:   Informed Consent:   Plan Discussed with:   Anesthesia Plan Comments:         Anesthesia Quick Evaluation

## 2016-09-30 NOTE — Anesthesia Procedure Notes (Signed)
Epidural Patient location during procedure: OB Start time: 09/30/2016 5:47 AM End time: 09/30/2016 5:54 AM  Staffing Anesthesiologist: Baldemar FridayGUIDETTI, Alexandra Bousquet S  Preanesthetic Checklist Completed: patient identified, site marked, surgical consent, pre-op evaluation, timeout performed, IV checked, risks and benefits discussed and monitors and equipment checked  Epidural Patient position: sitting Prep: site prepped and draped and DuraPrep Patient monitoring: continuous pulse ox and blood pressure Approach: midline Location: L4-L5 Injection technique: LOR saline  Needle:  Needle type: Tuohy  Needle gauge: 17 G Needle length: 9 cm and 9 Needle insertion depth: 4.5 cm Catheter type: closed end flexible Catheter size: 19 Gauge Catheter at skin depth: 9.5 cm Test dose: negative  Assessment Events: blood not aspirated, injection not painful, no injection resistance, negative IV test and no paresthesia

## 2016-09-30 NOTE — Lactation Note (Signed)
This note was copied from a baby's chart. Lactation Consultation Note  Patient Name: Alexandra Wright Today's Date: 09/30/2016 Reason for consult: Initial assessment (baby is 5 hours old and has BF x2 10 -30 mins ) and voided 1 and stool x1 .  Baby is 5 hours old. Presently mom is holding the baby and the baby is sleeping. Mom did ask about feeding cues and when to know it's time to feed. LC reviewed the benefits of STS every 2 -3 hours to increase hormones for breast feeding. Mom aware to call with feeding cues. Mother informed of post-discharge support and given phone number to the lactation department, including services for phone call assistance; out-patient appointments; and breastfeeding support group. List of other breastfeeding resources in the community given in the handout. Encouraged mother to call for problems or concerns related to breastfeeding. Per mom did not attend breast feeding perinatal classes.    Maternal Data    Feeding Feeding Type: Breast Fed Length of feed: 10 min  LATCH Score/Interventions                Intervention(s): Breastfeeding basics reviewed     Lactation Tools Discussed/Used     Consult Status Consult Status: Follow-up Date: 09/30/16 Follow-up type: In-patient    Alexandra Wright 09/30/2016, 3:04 PM

## 2016-09-30 NOTE — Progress Notes (Signed)
Pt seen this AM doing well. Comfortable after epidural. Pt had SOL. GBS positive. AROM. Continue PCN. Category 1 tracing expect vaginal delivery.

## 2016-09-30 NOTE — H&P (Signed)
Alexandra BloomCodi Leeson is a 20 y.o. female G1 @ 38.5wks presenting for reg ctx. Denies leaking or bldg. [facility] and has been essentially unremarkable.  OB History    Gravida Para Term Preterm AB Living   1 0 0 0 0 0   SAB TAB Ectopic Multiple Live Births   0 0 0 0       Past Medical History:  Diagnosis Date  . Medical history non-contributory    No past surgical history on file. Family History: family history is not on file. Social History:  reports that she has never smoked. She has never used smokeless tobacco. She reports that she does not drink alcohol or use drugs.     Maternal Diabetes: No Genetic Screening: Normal Maternal Ultrasounds/Referrals: Normal Fetal Ultrasounds or other Referrals:  None Maternal Substance Abuse:  No Significant Maternal Medications:  None Significant Maternal Lab Results:  Lab values include: Group B Strep positive Other Comments:  None  ROS History Cx 5/100/vtx -2 Blood pressure 118/70, pulse 79, temperature 98.3 F (36.8 C), temperature source Oral, resp. rate 18, height 5\' 6"  (1.676 m), weight 73 kg (161 lb), last menstrual period 01/03/2016, SpO2 100 %. Exam Physical Exam  Constitutional: She is oriented to person, place, and time. She appears well-developed.  HENT:  Head: Normocephalic.  Neck: Normal range of motion.  Cardiovascular: Normal rate.   Respiratory: Effort normal.  GI:  EFM 150s, min variability, some 10x10 accels Ctx q 2-3 mins  Musculoskeletal: Normal range of motion.  Neurological: She is alert and oriented to person, place, and time.  Skin: Skin is warm and dry.  Psychiatric: She has a normal mood and affect. Her behavior is normal. Thought content normal.    Prenatal labs: ABO, Rh: --/--/O POS (10/05 0325) Antibody: NEG (10/05 0325) Rubella:   imm RPR:   NR HBsAg:  neg  HIV:   NR GBS:   pos  Assessment/Plan: IUP@term  Early active labor GBS pos  [facility] PCN for GBS ppx Anticipate SVD  SHAW, KIMBERLY CNM 09/30/2016, 7:45 AM

## 2016-10-01 ENCOUNTER — Encounter (HOSPITAL_COMMUNITY): Payer: Self-pay

## 2016-10-01 LAB — RPR: RPR: NONREACTIVE

## 2016-10-01 NOTE — Lactation Note (Signed)
This note was copied from a baby's chart. Lactation Consultation Note: when I arrived in room mother has independently latched infant. Observed good rhythmic suckling and visible swallows. Mother was taught breast compression . Mother was praise for breastfeeding so well. Mother denies having any pain with breastfeeding. Advised mother to continue to breastfeed 8-12 times in 24 hours and do frequent STS.  Patient Name: Girl Dorna BloomCodi Dysert [id number]'s Date: 10/01/2016 Reason for consult: Follow-up assessment   Maternal Data    Feeding Feeding Type: Breast Fed  LATCH Score/Interventions Latch: Grasps breast easily, tongue down, lips flanged, rhythmical sucking. Intervention(s): Adjust position;Breast compression  Audible Swallowing: Spontaneous and intermittent  Type of Nipple: Everted at rest and after stimulation  Comfort (Breast/Nipple): Soft / non-tender     Hold (Positioning): No assistance needed to correctly position infant at breast.  LATCH Score: 10  Lactation Tools Discussed/Used     Consult Status Consult Status: Follow-up Date: 10/02/16 Follow-up type: In-patient    Stevan BornKendrick, Jiovani Mccammon [facility] 10/01/2016, 2:17 PM

## 2016-10-01 NOTE — Progress Notes (Signed)
UR chart review completed.  

## 2016-10-01 NOTE — Progress Notes (Signed)
Post Partum Day 1 Subjective: no complaints, up ad lib, voiding and tolerating PO  Objective: Blood pressure 108/65, pulse 88, temperature 98.2 F (36.8 C), resp. rate 18, height 5\' 6"  (1.676 m), weight 161 lb (73 kg), last menstrual period 01/03/2016, SpO2 100 %, unknown if currently breastfeeding.  Physical Exam:  General: alert, cooperative, appears stated age and no distress Lochia: appropriate Uterine Fundus: firm Incision: NA DVT Evaluation: Negative Homan's sign.   Recent Labs  09/30/16 0325  HGB 11.5*  HCT 33.2*    Assessment/Plan: Plan for discharge tomorrow, Breastfeeding and Contraception POPs   LOS: 1 day   Alexandra Wright 10/01/2016, 8:22 AM

## 2016-10-02 ENCOUNTER — Ambulatory Visit: Payer: Self-pay

## 2016-10-02 MED ORDER — IBUPROFEN 600 MG PO TABS: 600.0000 mg | ORAL_TABLET | Freq: Four times a day (QID) | ORAL | 0 refills | Status: AC

## 2016-10-02 MED ORDER — NORETHINDRONE 0.35 MG PO TABS: 1.0000 | ORAL_TABLET | Freq: Every day | ORAL | 11 refills | Status: AC

## 2016-10-02 NOTE — Discharge Summary (Signed)
OB Discharge Summary     Patient Name: Alexandra Wright DOB: [birth year] MRN: [id number]  Date of admission: 09/30/2016 Delivering MD: Marylene Land   Date of discharge: 10/02/2016  Admitting diagnosis: 40 WEEKS CTX Intrauterine pregnancy: [redacted]w[redacted]d     Secondary diagnosis:  Active Problems:   Indication for care in labor or delivery   Vaginal delivery  Additional problems: NONE     Discharge diagnosis: Term Pregnancy Delivered                                                                                                Post partum procedures:NONE  Augmentation: AROM  Complications: None  Hospital course:  Onset of Labor With Vaginal Delivery     20 y.o. yo G1P1001 at [redacted]w[redacted]d was admitted in Active Labor on 09/30/2016. Patient had an uncomplicated labor course as follows:  Membrane Rupture Time/Date: 7:39 AM ,09/30/2016   Intrapartum Procedures: Episiotomy: None [1]                                         Lacerations:  Periurethral [8];Labial [10];1st degree [2]  Patient had a delivery of a Viable infant. 09/30/2016  Information for the patient's newborn:  Aleira, Deiter Girl Jeannifer [[id number]]  Delivery Method: Vag-Spont    Pateint had an uncomplicated postpartum course.  She is ambulating, tolerating a regular diet, passing flatus, and urinating well. Patient is discharged home in stable condition on 10/02/16.    Physical exam Vitals:   09/30/16 1700 10/01/16 0545 10/01/16 2230 10/02/16 0535  BP: (!) 116/58 108/65 120/61 120/70  Pulse: 72 88 94 65  Resp: 20 18 18 18   Temp: 98.7 F (37.1 C) 98.2 F (36.8 C) 98.1 F (36.7 C) 98 F (36.7 C)  TempSrc: Oral  Oral Oral  SpO2:   99%   Weight:      Height:       General: alert, cooperative and no distress Lochia: appropriate Uterine Fundus: firm Incision: N/A DVT Evaluation: No evidence of DVT seen on physical exam. Labs: Lab Results  Component Value Date   WBC 7.5 09/30/2016   HGB 11.5 (L) 09/30/2016   HCT 33.2 (L)  09/30/2016   MCV 82.6 09/30/2016   PLT 277 09/30/2016   No flowsheet data found.  Discharge instruction: per After Visit Summary and "Baby and Me Booklet".  After visit meds:    Medication List    TAKE these medications   ibuprofen 600 MG tablet Commonly known as:  ADVIL,MOTRIN Take 1 tablet (600 mg total) by mouth every 6 (six) hours.   prenatal multivitamin Tabs tablet Take 1 tablet by mouth daily at 12 noon.       Diet: routine diet  Activity: Advance as tolerated. Pelvic rest for 6 weeks.   Outpatient follow up:4 WEEKS Follow up Appt:No future appointments. Follow up Visit:No Follow-up on file.  Postpartum contraception: Progesterone only pills  Newborn Data: Live born female  Birth Weight: 7 lb 2.8 oz (3255 g) APGAR: 9,  9  Baby Feeding: Breast Disposition:home with mother   10/02/2016 Marylene LandKathryn Lorraine Edmond Ginsberg, CNM

## 2016-10-02 NOTE — Anesthesia Postprocedure Evaluation (Signed)
Anesthesia Post Note  Patient: Alexandra Wright  Procedure(s) Performed: * No procedures listed *  Patient location during evaluation: Mother Baby Anesthesia Type: Epidural Level of consciousness: awake and alert Pain management: pain level controlled Vital Signs Assessment: post-procedure vital signs reviewed and stable Respiratory status: spontaneous breathing, nonlabored ventilation and respiratory function stable Cardiovascular status: stable Postop Assessment: no headache, no backache and epidural receding Anesthetic complications: no     Last Vitals: There were no vitals filed for this visit.  Last Pain: There were no vitals filed for this visit. Pain Goal:                 Linton RumpJennifer Dickerson Treylin Burtch

## 2016-10-02 NOTE — Discharge Instructions (Signed)

## 2016-10-02 NOTE — Lactation Note (Addendum)
This note was copied from a baby's chart. Lactation Consultation Note  Baby 4853 hours old and on phototherapy.   Suggest mother start post pumping because baby is starting to be sleepy at the breast. Mom shown how to use DEBP & how to disassemble, clean, & reassemble parts. Mom knows to pump 4-5 times per day for 15-20 min. Mother easily hand expressed drops and then latched baby in cradle.  Demonstrated how to massage/compress breast to keep her active and spoon feed.     Patient Name: Alexandra Wright     Maternal Data    Feeding    LATCH Score/Interventions                      Lactation Tools Discussed/Used     Consult Status      [name], [name] [name], 2:47 PM

## 2016-10-03 ENCOUNTER — Ambulatory Visit: Payer: Self-pay

## 2016-10-03 NOTE — Lactation Note (Signed)
This note was copied from a baby's chart. Lactation Consultation Note  Praised mother for her efforts with pumping while baby was on triple phototherapy. Weight increased and bilirubin reduced. Mother recently pumped in excess of 30 ml and will give to baby at next feeding. Encouraged her to start breastfeeding again now that baby is off phototherapy but continue to supplement after w/ pumped breastmilk.   Patient Name: Alexandra Wright ZOXWR'UToday's Date: 10/03/2016 Reason for consult: Follow-up assessment   Maternal Data    Feeding Feeding Type: Bottle Fed - Breast Milk Nipple Type: Slow - flow  LATCH Score/Interventions                      Lactation Tools Discussed/Used     Consult Status Consult Status: Follow-up Date: 10/04/16 Follow-up type: In-patient    [name], Hu[place] Glaus [facility] 10/03/2016, 8:57 AM
